# Patient Record
Sex: Male | Born: 1965 | Race: Black or African American | Hispanic: No | Marital: Married | Smoking: Never smoker
Health system: Southern US, Community
[De-identification: ages and names within clinical notes are randomized; demographics above are authoritative.]

## PROBLEM LIST (undated history)

## (undated) DIAGNOSIS — G4730 Sleep apnea, unspecified: Secondary | ICD-10-CM

## (undated) DIAGNOSIS — I10 Essential (primary) hypertension: Secondary | ICD-10-CM

## (undated) HISTORY — DX: Essential (primary) hypertension: I10

## (undated) HISTORY — DX: Sleep apnea, unspecified: G47.30

---

## 1999-04-03 HISTORY — PX: HERNIA REPAIR: SHX51

## 1999-07-27 ENCOUNTER — Ambulatory Visit (HOSPITAL_COMMUNITY): Admission: RE | Admit: 1999-07-27 | Discharge: 1999-07-27 | Payer: Self-pay | Admitting: General Surgery

## 2000-04-13 ENCOUNTER — Encounter: Payer: Self-pay | Admitting: Internal Medicine

## 2000-04-13 ENCOUNTER — Emergency Department (HOSPITAL_COMMUNITY): Admission: EM | Admit: 2000-04-13 | Discharge: 2000-04-13 | Payer: Self-pay | Admitting: Internal Medicine

## 2010-07-04 ENCOUNTER — Ambulatory Visit (INDEPENDENT_AMBULATORY_CARE_PROVIDER_SITE_OTHER): Payer: BC Managed Care – PPO | Admitting: Internal Medicine

## 2010-07-04 ENCOUNTER — Encounter: Payer: Self-pay | Admitting: Internal Medicine

## 2010-07-04 VITALS — BP 148/108 | HR 69 | Temp 98.4°F | Ht 69.0 in | Wt 210.8 lb

## 2010-07-04 DIAGNOSIS — R05 Cough: Secondary | ICD-10-CM

## 2010-07-04 DIAGNOSIS — R059 Cough, unspecified: Secondary | ICD-10-CM

## 2010-07-04 MED ORDER — FLUTICASONE PROPIONATE 50 MCG/ACT NA SUSP: 2.0000 | Freq: Every day | NASAL | Status: DC

## 2010-07-04 MED ORDER — FEXOFENADINE HCL 180 MG PO TABS: 180.0000 mg | ORAL_TABLET | Freq: Every day | ORAL | Status: AC

## 2010-07-04 NOTE — Assessment & Plan Note (Signed)
Your cough is likely due to sinus drainage Take allegra daily Use netti pot with bottled warm water and attached salt packet daily Use nasal steroid daily as directed Return to see me in 4-5 weeks Depending on above, you might need ENT referral  

## 2010-07-04 NOTE — Patient Instructions (Signed)
Your cough is likely due to sinus drainage Take allegra daily Use netti pot with bottled warm water and attached salt packet daily Use nasal steroid daily as directed Return to see me in 4-5 weeks Depending on above, you might need ENT referral

## 2010-07-04 NOTE — Progress Notes (Signed)
Subjective:    Patient ID: Dustin Cooper, male    DOB: [birth year], 45 y.o.   MRN: [id number]  Cough This is a new (insidious onset) problem. The current episode started more than 1 month ago (several months ago it started). The problem has been gradually worsening. The problem occurs constantly. The cough is productive of sputum (feels like a really bad smoker's cough. Mostly has sputum - mild amount of clear sputum. Turned green 3 days ago. ). Associated symptoms include nasal congestion, postnasal drip, rhinorrhea and sweats. Pertinent negatives include no chest pain, chills, ear congestion, ear pain, fever, headaches, heartburn, hemoptysis, myalgias, rash, sore throat, shortness of breath or wheezing. Associated symptoms comments: Feels embarassed about cough. Laughing making cough worse over past few weeks. Associated voice change +. Admits to periodic sinus drainage but constant nasal congestion for years. Used to use nose drops OTC but now stopped using it. Denies GERD. The symptoms are aggravated by other (laughing). Risk factors for lung disease include animal exposure (lived with cats and dogs in 2007 - 2010. ). He has tried OTC cough suppressant (Dr Eileen [facility] on [address] - changed bp med in response to cough 2 months ago but this change did not help. GERD OTC trial of  med nos for 1 month did not help. He cannot remember names of meds. Pharmacy stated he is taking bystolic hctz now) for the symptoms. The treatment provided no relief. There is no history of asthma, bronchiectasis, bronchitis, COPD, emphysema, environmental allergies or pneumonia.      Review of Systems  Constitutional: Negative for fever and chills.  HENT: Positive for rhinorrhea and postnasal drip. Negative for ear pain and sore throat.   Eyes: Negative.   Respiratory: Positive for cough. Negative for hemoptysis, choking, chest tightness, shortness of breath, wheezing and stridor.   Cardiovascular: Negative.   Negative for chest pain, palpitations and leg swelling.  Gastrointestinal: Negative.  Negative for heartburn.  Genitourinary: Negative.   Musculoskeletal: Negative.  Negative for myalgias.  Skin: Negative.  Negative for rash.  Neurological: Negative.  Negative for headaches.  Hematological: Negative.  Negative for environmental allergies.  Psychiatric/Behavioral: Negative.        Objective:   Physical Exam  Nursing note and vitals reviewed. Constitutional: He is oriented to person, place, and time. He appears well-developed and well-nourished. No distress.       obese  HENT:  Head: Normocephalic and atraumatic.  Right Ear: External ear normal.  Left Ear: External ear normal.  Mouth/Throat: Oropharynx is clear and moist. No oropharyngeal exudate.       Swollen nasal turbinates bilaterally  Eyes: Conjunctivae and EOM are normal. Pupils are equal, round, and reactive to light. Right eye exhibits no discharge. Left eye exhibits no discharge. No scleral icterus.  Neck: Normal range of motion. Neck supple. No JVD present. No tracheal deviation present. No thyromegaly present.  Cardiovascular: Normal rate, regular rhythm and intact distal pulses.  Exam reveals no gallop and no friction rub.   No murmur heard. Pulmonary/Chest: Effort normal and breath sounds normal. No respiratory distress. He has no wheezes. He has no rales. He exhibits no tenderness.  Abdominal: Soft. Bowel sounds are normal. He exhibits no distension and no mass. There is no tenderness. There is no rebound and no guarding.  Musculoskeletal: Normal range of motion. He exhibits no edema and no tenderness.  Lymphadenopathy:    He has no cervical adenopathy.  Neurological: He is alert and oriented  to person, place, and time. He has normal reflexes. No cranial nerve deficit. Coordination normal.  Skin: Skin is warm and dry. No rash noted. He is not diaphoretic. No erythema. No pallor.  Psychiatric: He has a normal mood and  affect. His behavior is normal. Judgment and thought content normal.          Assessment & Plan:

## 2010-08-07 ENCOUNTER — Ambulatory Visit: Payer: BC Managed Care – PPO | Admitting: Internal Medicine

## 2011-02-09 ENCOUNTER — Encounter (HOSPITAL_COMMUNITY): Payer: Self-pay | Admitting: *Deleted

## 2011-02-09 ENCOUNTER — Emergency Department (HOSPITAL_COMMUNITY)
Admission: EM | Admit: 2011-02-09 | Discharge: 2011-02-09 | Disposition: A | Discharge status: Home / Self Care | Payer: BC Managed Care – PPO | Attending: Emergency Medicine | Admitting: Emergency Medicine

## 2011-02-09 ENCOUNTER — Emergency Department (HOSPITAL_COMMUNITY): Payer: BC Managed Care – PPO

## 2011-02-09 DIAGNOSIS — G510 Bell's palsy: Secondary | ICD-10-CM | POA: Insufficient documentation

## 2011-02-09 DIAGNOSIS — I10 Essential (primary) hypertension: Secondary | ICD-10-CM | POA: Insufficient documentation

## 2011-02-09 DIAGNOSIS — R209 Unspecified disturbances of skin sensation: Secondary | ICD-10-CM | POA: Insufficient documentation

## 2011-02-09 MED ORDER — HYDROCHLOROTHIAZIDE 25 MG PO TABS: 25.0000 mg | ORAL_TABLET | Freq: Every day | ORAL | Status: AC

## 2011-02-09 MED ORDER — CLONIDINE HCL 0.1 MG PO TABS
0.1000 mg | ORAL_TABLET | Freq: Once | ORAL | Status: AC
  Administered 2011-02-09: 0.1 mg via ORAL
  Filled 2011-02-09: qty 1

## 2011-02-09 MED ORDER — ACYCLOVIR 400 MG PO TABS: ORAL_TABLET | ORAL | Status: AC

## 2011-02-09 MED ORDER — PREDNISONE 20 MG PO TABS: ORAL_TABLET | ORAL | Status: AC

## 2011-02-09 MED ORDER — METOPROLOL TARTRATE 50 MG PO TABS: ORAL_TABLET | ORAL | Status: AC

## 2011-02-09 NOTE — ED Notes (Signed)
NWG:[id number]<ZH> Expected date:02/09/11<BR> Expected time: 6:05 PM<BR> Means of arrival:Ambulance<BR> Comments:<BR> EMS 12 GC, 38 yof abd. Pain CA pt

## 2011-02-09 NOTE — ED Notes (Signed)
Pt discharged home with family member; voiced understanding of need for bp med compliance and follow up; continues to c/o right sided facial numbness; no other complaints

## 2011-02-09 NOTE — ED Provider Notes (Cosign Needed)
History     CSN: [id number] Arrival date & time: 02/09/2011  5:27 PM   First MD Initiated Contact with Patient 02/09/11 1932      Chief Complaint  Patient presents with  . Facial Numbness      This morning    (Consider location/radiation/quality/duration/timing/severity/associated sxs/prior treatment) HPI  Patient relates he noticed Lack of taste starting about 3 days ago. He states this morning he woke up and noticed the right side of his face felt numb. He denies any difficulty swallowing or drinking. He states he had a frontal headache when he first got up this morning but he thought was his sinuses but now is gone. He denies any visual changes. He states he does not have ear pain. He also notes he is having trouble closing his right thigh. Patient relates he has hypertension and is been off his medicine for at least a month. He denies any numbness or tingling in his arms or legs difficulty speaking or walking. He states he's never had this before. Nothing makes it feel worse nothing makes it feel better.  [facility]  Past Medical History  Diagnosis Date  . Hypertension   . Sleep apnea     Past Surgical History  Procedure Date  . Hernia repair 2001    Family History  Problem Relation Age of Onset  . Heart disease Mother   . Heart failure Mother   . Hypertension Mother     History  Substance Use Topics  . Smoking status: Never Smoker   . Smokeless tobacco: Not on file  . Alcohol Use: Yes     3-4 times per week   Employed Lives with spouse   Review of Systems  All other systems reviewed and are negative.    Allergies  Review of patient's allergies indicates no known allergies.  Home Medications   Current Outpatient Rx  Name Route Sig Dispense Refill  . ASPIRIN 81 MG PO TABS Oral Take 81 mg by mouth daily.      Marland Kitchen HYDROCHLOROTHIAZIDE 25 MG PO TABS Oral Take 25 mg by mouth daily.      . NEBIVOLOL HCL 10 MG PO TABS  Oral Take 20 mg by mouth daily.         BP 177/135  Pulse 60  Temp(Src) 99.4 F (37.4 C) (Oral)  Resp 17  SpO2 98%    Vital signs hypertension otherwise normal  Physical Exam  Vitals reviewed. Constitutional: He is oriented to person, place, and time. He appears well-developed and well-nourished.  HENT:  Head: Normocephalic and atraumatic.  Mouth/Throat: Uvula is midline, oropharynx is clear and moist and mucous membranes are normal.  Eyes: Conjunctivae and EOM are normal. Pupils are equal, round, and reactive to light.  Neck: Normal range of motion. Neck supple.  Cardiovascular: Normal rate, regular rhythm and normal heart sounds.   Pulmonary/Chest: Effort normal and breath sounds normal.  Abdominal: Soft. Bowel sounds are normal.  Musculoskeletal: Normal range of motion.  Neurological: He is alert and oriented to person, place, and time.       Patient is noted to have a right facial droop when he smiles. He has no loss of range of motion of his tongue. He has difficulty shutting his right thigh and there is a small gap that he is unable to close about 2-3 mm. He does however have equal eyebrow movement bilaterally. He has no other focal motor deficit.  ED Course  Procedures (including critical care time) Patient given clonidine 0.1 mg orally his blood pressure improved to the 177/124 range.     Ct Head Wo Contrast  02/09/2011  *RADIOLOGY REPORT*  Clinical Data: Right-sided facial weakness.  CT HEAD WITHOUT CONTRAST  Technique:  Contiguous axial images were obtained from the base of the skull through the vertex without contrast.  Comparison: None  Findings: The ventricles are normal.  No extra-axial fluid collections are seen.  The brainstem and cerebellum are unremarkable.  No acute intracranial findings such as infarction or hemorrhage.  No mass lesions.  The bony calvarium is intact.  The visualized paranasal sinuses and mastoid air cells are clear.  IMPRESSION: No acute  intracranial findings or mass lesion.  Original Report Authenticated By: P. Loralie Champagne, M.D.      Diagnoses that have been ruled out:  Diagnoses that are still under consideration:  Final diagnoses:  Bell's palsy  Hypertension     Medications  hydrochlorothiazide (HYDRODIURIL) 25 MG tablet (not administered)  metoprolol (LOPRESSOR) 50 MG tablet (not administered)  predniSONE (DELTASONE) 20 MG tablet (not administered)  acyclovir (ZOVIRAX) 400 MG tablet (not administered)  cloNIDine (CATAPRES) tablet 0.1 mg (0.1 mg Oral Given 02/09/11 2051)   Plan discharge  Devoria Albe, MD, FACEP   MDM          Ward Givens, MD 02/10/11 [phone]

## 2011-02-09 NOTE — ED Notes (Signed)
Tuesday patient reports that he lost ability to taste food and then this morning patient woke up with AM with inability to frown, smile or chew using the right side of his face. Pt denies similar prior history. No pain. Right eye vision also feels blurred. Pt is alert and oriented and voice is clear and not garbled. No issues with ambulation.  Equal upper extremities.

## 2011-02-09 NOTE — ED Notes (Signed)
Pt resting quietly; no change in assessment; family at bedside

## 2011-07-11 ENCOUNTER — Encounter: Payer: Self-pay | Admitting: *Deleted

## 2012-07-27 IMAGING — CT CT HEAD W/O CM
2 series · 16 of 30 positions shown, 20 images · non-contrast
Comparison: None

CLINICAL DATA: Right-sided facial weakness.

CT HEAD WITHOUT CONTRAST
TECHNIQUE: Contiguous axial images were obtained from the base of
the skull through the vertex without contrast.

[Series 2: head w/o · axial · non-contrast · 0.43mm/px · z∈[+1186,+1316]mm · 13 of 32 slices shown, 17 images]
[im 3/32  brain]
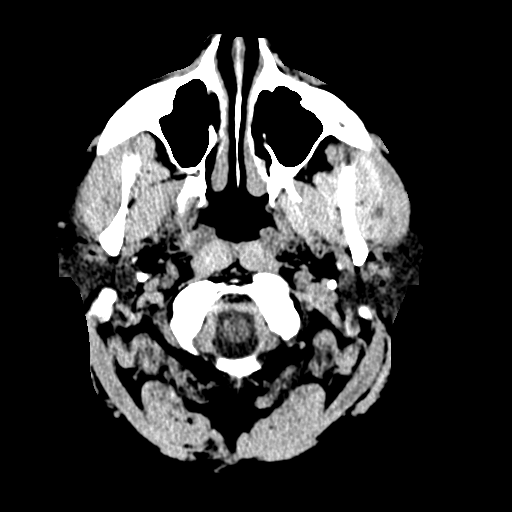
[im 3/32  bone]
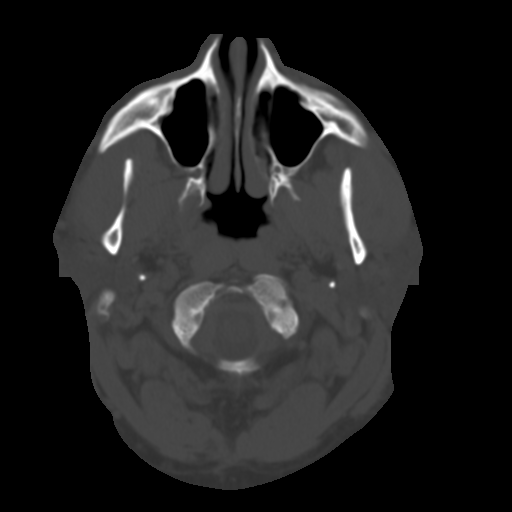
[im 5/32  brain]
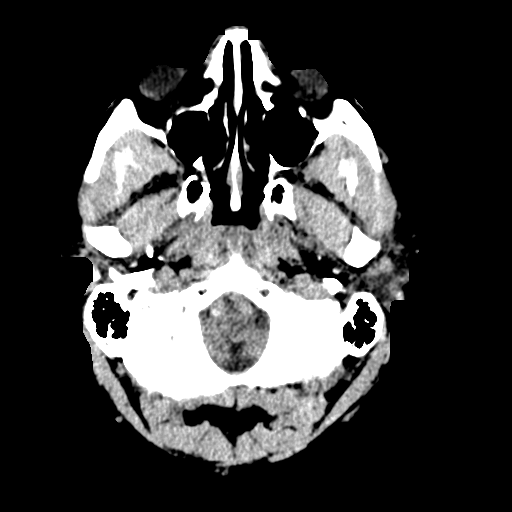
[im 7/32  brain]
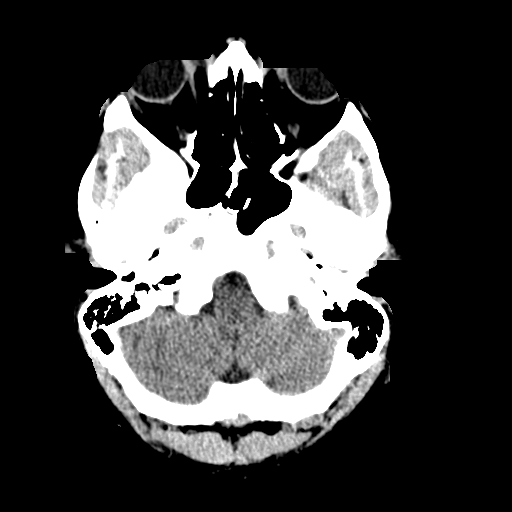
[im 9/32  brain]
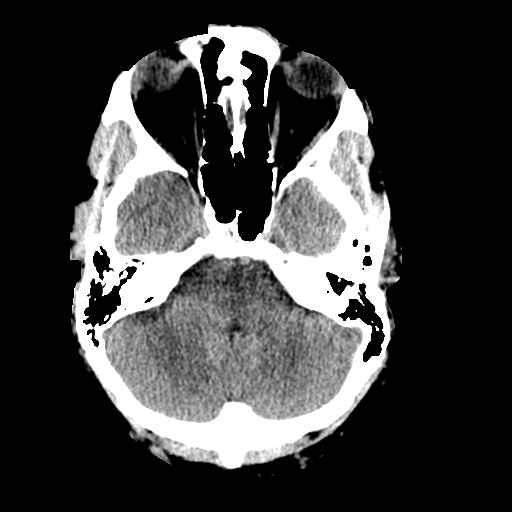
[im 12/32  brain]
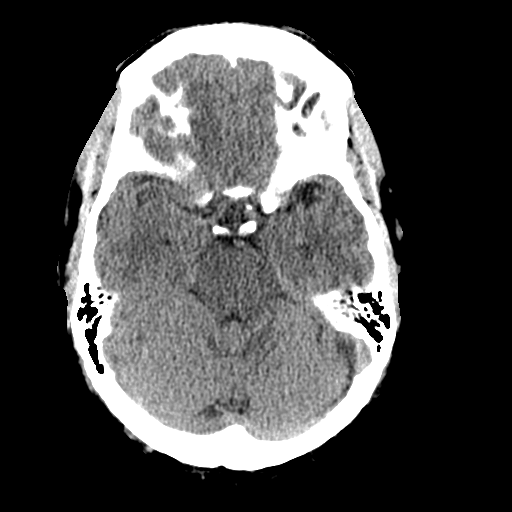
[im 12/32  bone]
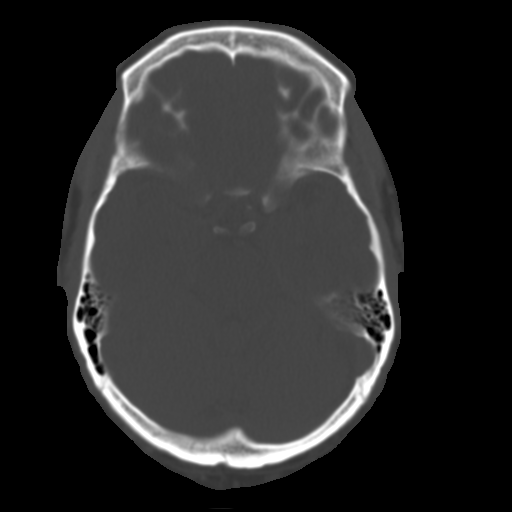
[im 14/32  brain]
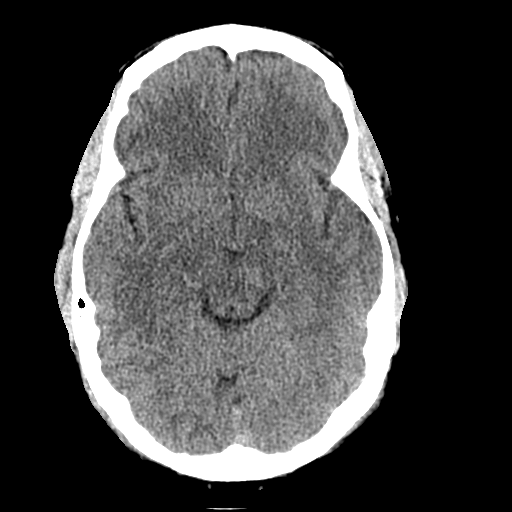
[im 16/32  brain]
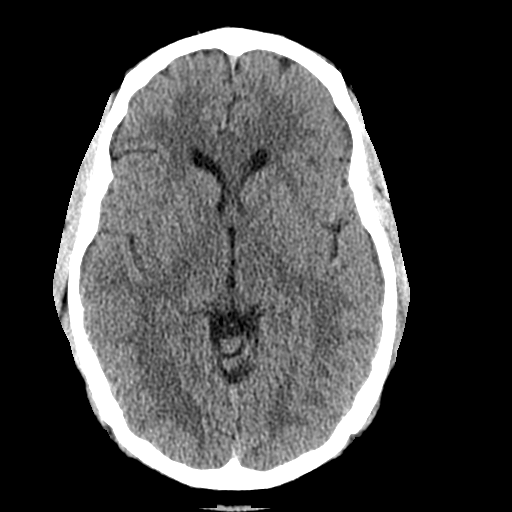
[im 18/32  brain]
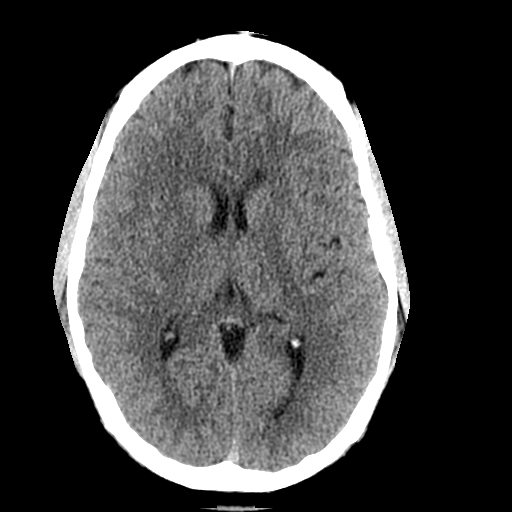
[im 20/32  brain]
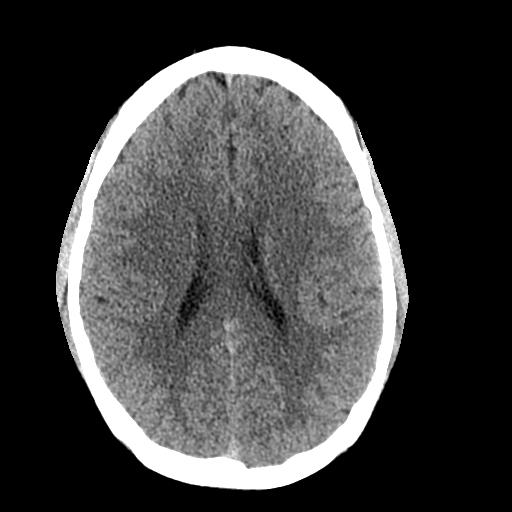
[im 20/32  bone]
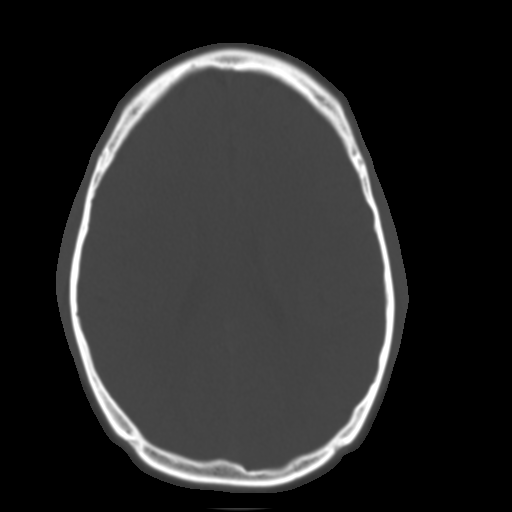
[im 23/32  brain]
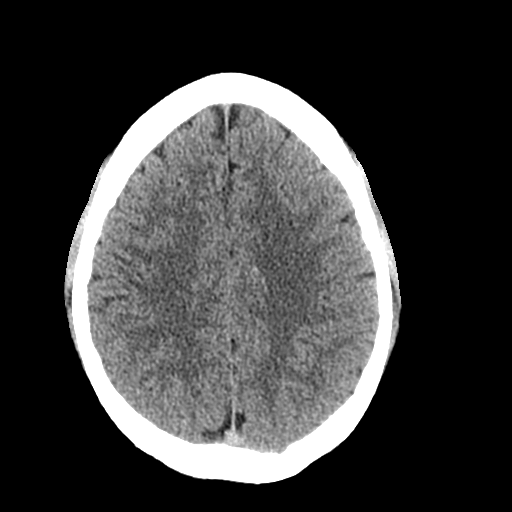
[im 25/32  brain]
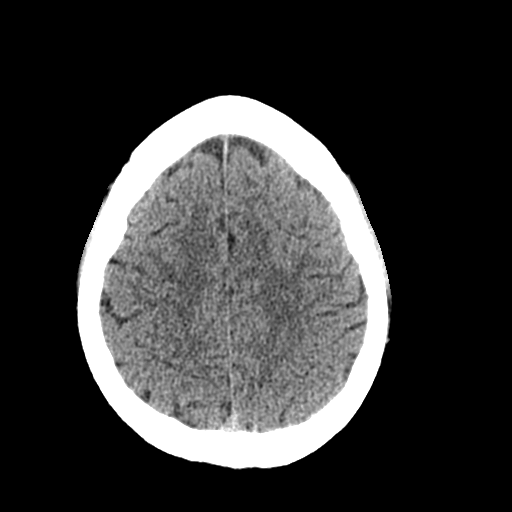
[im 27/32  brain]
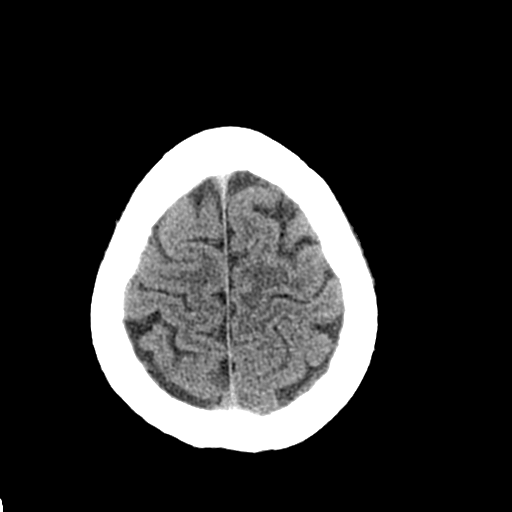
[im 29/32  brain]
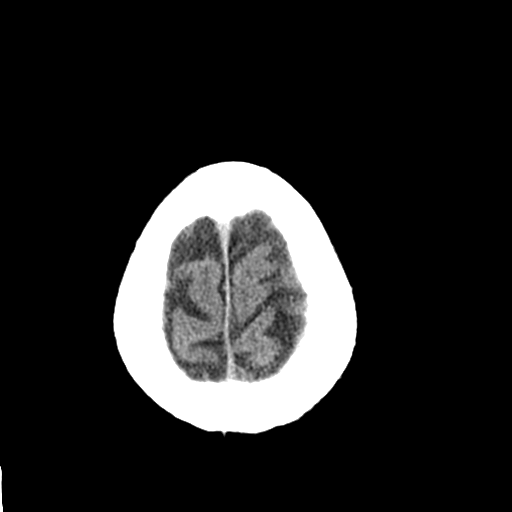
[im 29/32  bone]
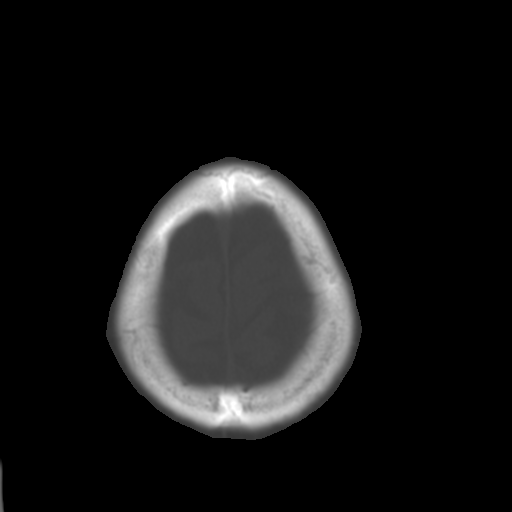

[Series 3: bone windows · axial · 0.43mm/px · z∈[+1186,+1231]mm · 3 of 32 slices shown]
[im 3/32  bone]
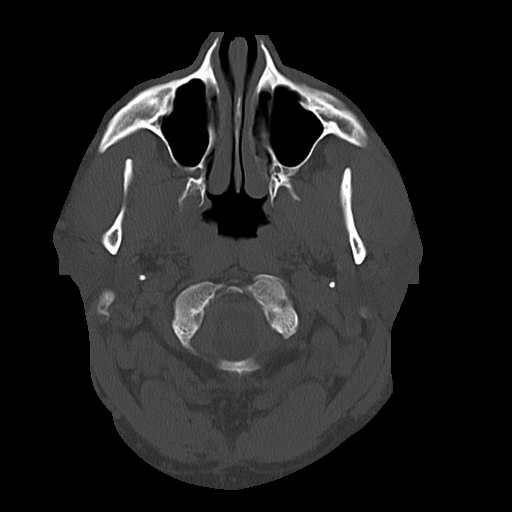
[im 7/32  bone]
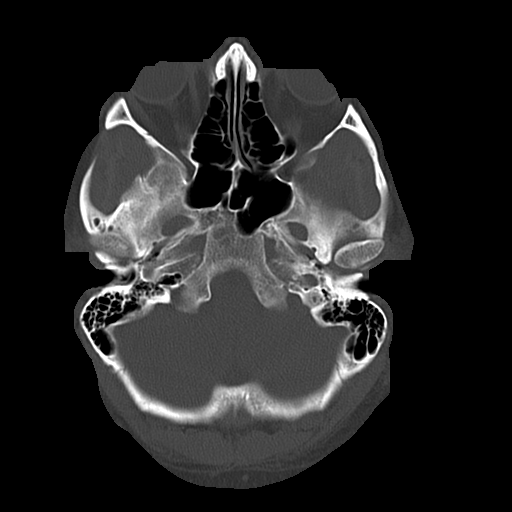
[im 12/32  bone]
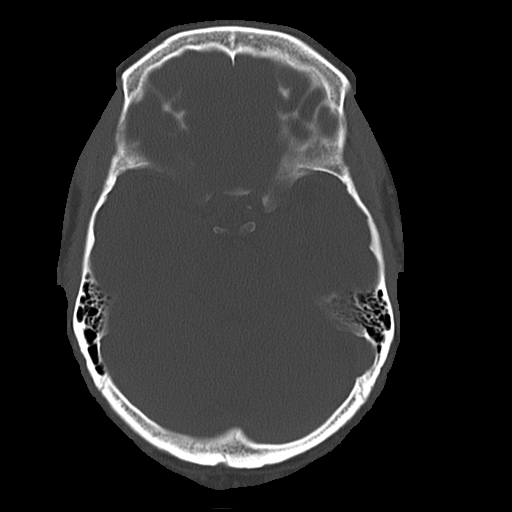

[16 of 30 positions shown; findings below may reference images not displayed]

FINDINGS: The ventricles are normal.  No extra-axial fluid
collections are seen.  The brainstem and cerebellum are
unremarkable.  No acute intracranial findings such as infarction or
hemorrhage.  No mass lesions.

The bony calvarium is intact.  The visualized paranasal sinuses and
mastoid air cells are clear.
IMPRESSION: No acute intracranial findings or mass lesion.

## 2015-01-09 ENCOUNTER — Encounter (HOSPITAL_COMMUNITY): Payer: Self-pay | Admitting: Emergency Medicine

## 2015-01-09 ENCOUNTER — Emergency Department (HOSPITAL_COMMUNITY)
Admission: EM | Admit: 2015-01-09 | Discharge: 2015-01-10 | Disposition: A | Discharge status: Home / Self Care | Payer: BLUE CROSS/BLUE SHIELD | Attending: Emergency Medicine | Admitting: Emergency Medicine

## 2015-01-09 DIAGNOSIS — R1013 Epigastric pain: Secondary | ICD-10-CM | POA: Diagnosis present

## 2015-01-09 DIAGNOSIS — Z7982 Long term (current) use of aspirin: Secondary | ICD-10-CM | POA: Diagnosis not present

## 2015-01-09 DIAGNOSIS — Z79899 Other long term (current) drug therapy: Secondary | ICD-10-CM | POA: Insufficient documentation

## 2015-01-09 DIAGNOSIS — IMO0001 Reserved for inherently not codable concepts without codable children: Secondary | ICD-10-CM

## 2015-01-09 DIAGNOSIS — I10 Essential (primary) hypertension: Secondary | ICD-10-CM | POA: Diagnosis not present

## 2015-01-09 DIAGNOSIS — Z8669 Personal history of other diseases of the nervous system and sense organs: Secondary | ICD-10-CM | POA: Insufficient documentation

## 2015-01-09 DIAGNOSIS — K2970 Gastritis, unspecified, without bleeding: Secondary | ICD-10-CM | POA: Diagnosis not present

## 2015-01-09 NOTE — ED Notes (Signed)
Bed: WA19 Expected date:  Expected time:  Means of arrival:  Comments: T2 

## 2015-01-09 NOTE — ED Notes (Signed)
Pt states around 10:30 was sitting at home watching TV and felt a sudden stabbing/tearing/shooting pain go from the center of his chest down into his abdomin. Pt states he felt nauseous on the way here, and is continuing to feel the pain at this time.

## 2015-01-10 ENCOUNTER — Emergency Department (HOSPITAL_COMMUNITY): Payer: BLUE CROSS/BLUE SHIELD

## 2015-01-10 ENCOUNTER — Encounter (HOSPITAL_COMMUNITY): Payer: Self-pay | Admitting: Emergency Medicine

## 2015-01-10 LAB — HEPATIC FUNCTION PANEL
ALBUMIN: 3.9 g/dL (ref 3.5–5.0)
ALK PHOS: 75 U/L (ref 38–126)
ALT: 63 U/L (ref 17–63)
AST: 109 U/L — ABNORMAL HIGH (ref 15–41)
BILIRUBIN INDIRECT: 1.1 mg/dL — AB (ref 0.3–0.9)
Bilirubin, Direct: 0.3 mg/dL (ref 0.1–0.5)
TOTAL PROTEIN: 7.5 g/dL (ref 6.5–8.1)
Total Bilirubin: 1.4 mg/dL — ABNORMAL HIGH (ref 0.3–1.2)

## 2015-01-10 LAB — BASIC METABOLIC PANEL
Anion gap: 9 (ref 5–15)
BUN: 17 mg/dL (ref 6–20)
CHLORIDE: 107 mmol/L (ref 101–111)
CO2: 24 mmol/L (ref 22–32)
CREATININE: 1.34 mg/dL — AB (ref 0.61–1.24)
Calcium: 9.6 mg/dL (ref 8.9–10.3)
Glucose, Bld: 103 mg/dL — ABNORMAL HIGH (ref 65–99)
POTASSIUM: 3.4 mmol/L — AB (ref 3.5–5.1)
SODIUM: 140 mmol/L (ref 135–145)

## 2015-01-10 LAB — LIPASE, BLOOD: LIPASE: 63 U/L — AB (ref 22–51)

## 2015-01-10 LAB — CBC
HCT: 50.1 % (ref 39.0–52.0)
Hemoglobin: 16.7 g/dL (ref 13.0–17.0)
MCH: 28.7 pg (ref 26.0–34.0)
MCHC: 33.3 g/dL (ref 30.0–36.0)
MCV: 86.2 fL (ref 78.0–100.0)
PLATELETS: 165 10*3/uL (ref 150–400)
RBC: 5.81 MIL/uL (ref 4.22–5.81)
RDW: 13.5 % (ref 11.5–15.5)
WBC: 13.7 10*3/uL — AB (ref 4.0–10.5)

## 2015-01-10 LAB — I-STAT TROPONIN, ED: Troponin i, poc: 0 ng/mL (ref 0.00–0.08)

## 2015-01-10 MED ORDER — OMEPRAZOLE 20 MG PO CPDR: 20.0000 mg | DELAYED_RELEASE_CAPSULE | Freq: Every day | ORAL | Status: AC

## 2015-01-10 MED ORDER — KETOROLAC TROMETHAMINE 30 MG/ML IJ SOLN
30.0000 mg | Freq: Once | INTRAMUSCULAR | Status: AC
  Administered 2015-01-10: 30 mg via INTRAVENOUS
  Filled 2015-01-10: qty 1

## 2015-01-10 MED ORDER — ONDANSETRON HCL 4 MG/2ML IJ SOLN
4.0000 mg | Freq: Once | INTRAMUSCULAR | Status: AC
  Administered 2015-01-10: 4 mg via INTRAVENOUS
  Filled 2015-01-10: qty 2

## 2015-01-10 MED ORDER — GI COCKTAIL ~~LOC~~
30.0000 mL | Freq: Once | ORAL | Status: AC
  Administered 2015-01-10: 30 mL via ORAL
  Filled 2015-01-10: qty 30

## 2015-01-10 MED ORDER — SODIUM CHLORIDE 0.9 % IV BOLUS (SEPSIS)
1000.0000 mL | Freq: Once | INTRAVENOUS | Status: AC
  Administered 2015-01-10: 1000 mL via INTRAVENOUS

## 2015-01-10 MED ORDER — DICYCLOMINE HCL 10 MG/ML IM SOLN
20.0000 mg | Freq: Once | INTRAMUSCULAR | Status: AC
  Administered 2015-01-10: 20 mg via INTRAMUSCULAR
  Filled 2015-01-10: qty 2

## 2015-01-10 NOTE — Discharge Instructions (Signed)

## 2015-01-10 NOTE — ED Provider Notes (Signed)
CSN: [id number]     Arrival date & time 01/09/15  2321 History  By signing my name below, I, Dustin Cooper, attest that this documentation has been prepared under the direction and in the presence of Taavi Hoose, MD. Electronically Signed: Ronney Cooper, ED Scribe. 01/10/2015. 12:46 AM.   Chief Complaint  Patient presents with  . Chest Pain  . Hypertension  . Abdominal Pain   Patient is a 49 y.o. male presenting with abdominal pain. The history is provided by the patient. No language interpreter was used.  Abdominal Pain Pain location:  Epigastric Pain quality: aching and stabbing   Pain radiates to:  Periumbilical region Pain severity:  Severe Onset quality:  Gradual Duration:  10 hours Timing:  Constant Progression:  Worsening Chronicity:  New Context: eating   Context: not sick contacts and not trauma   Relieved by:  OTC medications (Gas-X) Worsened by:  Nothing tried Ineffective treatments:  None tried Associated symptoms: flatus   Associated symptoms: no belching, no chest pain, no constipation, no diarrhea, no fatigue, no fever and no vomiting   Risk factors: has not had multiple surgeries     HPI Comments: Dustin Cooper is a 49 y.o. male who presents to the Emergency Department complaining of constant, severe pain that begins in his epigastrium and radiates down to his umbilicus, with onset 2 hours ago while sitting at home. Patient reports earlier today he ate [place] food, including steak and refried beans, which caused an uncomfortable amount flatus. He then took Gas-X, which alleviated his flatus. Patient states he last ate fast food rice (from [employer]) about 7 hours ago, at 5 PM. Patient notes on a regular day, he eats greasy and fried foods. He also eats [employer] bars. Patient denies any known history of GERD. He denies frequent belching.   Past Medical History  Diagnosis Date  . Hypertension   . Sleep apnea    Past Surgical History  Procedure Laterality Date  .  Hernia repair  2001   Family History  Problem Relation Age of Onset  . Heart disease Mother   . Heart failure Mother   . Hypertension Mother    Social History  Substance Use Topics  . Smoking status: Never Smoker   . Smokeless tobacco: None  . Alcohol Use: Yes     Comment: 3-4 times per week    Review of Systems  Constitutional: Negative for fever and fatigue.  Cardiovascular: Negative for chest pain.  Gastrointestinal: Positive for abdominal pain and flatus. Negative for vomiting, diarrhea and constipation.  All other systems reviewed and are negative.  Allergies  Review of patient's allergies indicates no known allergies.  Home Medications   Prior to Admission medications   Medication Sig Start Date End Date Taking? Authorizing Provider  acyclovir (ZOVIRAX) 400 MG tablet Take 1 po 5 times a day for 7 days 02/09/11   Devoria Albe, MD  aspirin 81 MG tablet Take 81 mg by mouth daily.      Historical Provider, MD  hydrochlorothiazide (HYDRODIURIL) 25 MG tablet Take 25 mg by mouth daily.      Historical Provider, MD  hydrochlorothiazide (HYDRODIURIL) 25 MG tablet Take 1 tablet (25 mg total) by mouth daily. 02/09/11 02/09/12  Devoria Albe, MD  metoprolol (LOPRESSOR) 50 MG tablet Take 1 po QD 02/09/11   Devoria Albe, MD  predniSONE (DELTASONE) 20 MG tablet Take 3 po QD x 3d , then 2 po QD x 3d then 1 po QD x  3d 02/09/11   Devoria Albe, MD   BP 168/113 mmHg  Pulse 102  Temp(Src) 98 F (36.7 C) (Oral)  Resp 17  SpO2 99% Physical Exam  Constitutional: He is oriented to person, place, and time. He appears well-developed and well-nourished. No distress.  HENT:  Head: Normocephalic and atraumatic.  Mouth/Throat: Oropharynx is clear and moist.  Moist mm.  Eyes: Conjunctivae and EOM are normal. Pupils are equal, round, and reactive to light.  Neck: Normal range of motion. Neck supple. No tracheal deviation present.  Cardiovascular: Normal rate and regular rhythm.   Pulmonary/Chest: Effort normal  and breath sounds normal. No respiratory distress. He has no wheezes. He has no rales. He exhibits no tenderness.  Abdominal: Soft. Bowel sounds are increased. There is no tenderness. There is no rigidity, no rebound, no guarding, no tenderness at McBurney's point and negative Murphy's sign.  Hyperactive bowel sounds throughout.  Musculoskeletal: Normal range of motion.  Neurological: He is alert and oriented to person, place, and time.  Skin: Skin is warm and dry.  Psychiatric: He has a normal mood and affect. His behavior is normal.  Nursing note and vitals reviewed.   ED Course  Procedures (including critical care time)  DIAGNOSTIC STUDIES: Oxygen Saturation is 99% on RA, normal by my interpretation.    COORDINATION OF CARE: 12:13 AM - Suspect pain is from gas, based on patient's diet. Discussed treatment plan with patient at bedside, which includes symptomatic control here. Will also obtain LFTs. Pt verbalized understanding and agreed to plan.   Labs Review Labs Reviewed  BASIC METABOLIC PANEL - Abnormal; Notable for the following:    Potassium 3.4 (*)    Glucose, Bld 103 (*)    Creatinine, Ser 1.34 (*)    All other components within normal limits  CBC - Abnormal; Notable for the following:    WBC 13.7 (*)    All other components within normal limits  HEPATIC FUNCTION PANEL  LIPASE, BLOOD  I-STAT TROPOININ, ED    Imaging Review No results found. I have personally reviewed and evaluated these images and lab results as part of my medical decision-making.   EKG Interpretation   Date/Time:  Sunday January 09 2015 23:29:24 EDT Ventricular Rate:  107 PR Interval:  155 QRS Duration: 87 QT Interval:  311 QTC Calculation: 415 R Axis:   30 Text Interpretation:  Sinus tachycardia Confirmed by [facility]  MD,  Irine Heminger ([id number]) on 01/10/2015 12:25:24 AM      MDM   Final diagnoses:  None   Pain gone post medication.  Patient states he drinks a few beers every few days  and this may explain the elevated AST.  No murphy's sign.  Was not given narcotics to contract the GB.  Have recommended no ETOH, GERD friendly diet and follow up as outpatient for HIDA scan.  Patient and family are amenable to this plan.  Strict return precautions given.    I, Linda Biehn-RASCH,Clarita Mcelvain K, personally performed the services described in this documentation. All medical record entries made by the scribe were at my direction and in my presence.  I have reviewed the chart and discharge instructions and agree that the record reflects my personal performance and is accurate and complete. Tammela Bales-RASCH,Airis Barbee K.  01/10/2015. 5:01 AM.       Teion Ballin, MD 01/10/15 6045

## 2016-06-27 IMAGING — CR DG CHEST 2V
2 series · 2 of 2 positions shown · non-contrast
Comparison: CT Abdomen and Pelvis 03/10/2012.

CLINICAL DATA: 49-year-old male with midchest in upper abdominal
pain for 3 hours. Initial encounter.

EXAM:
CHEST  2 VIEW

[w chest pa]
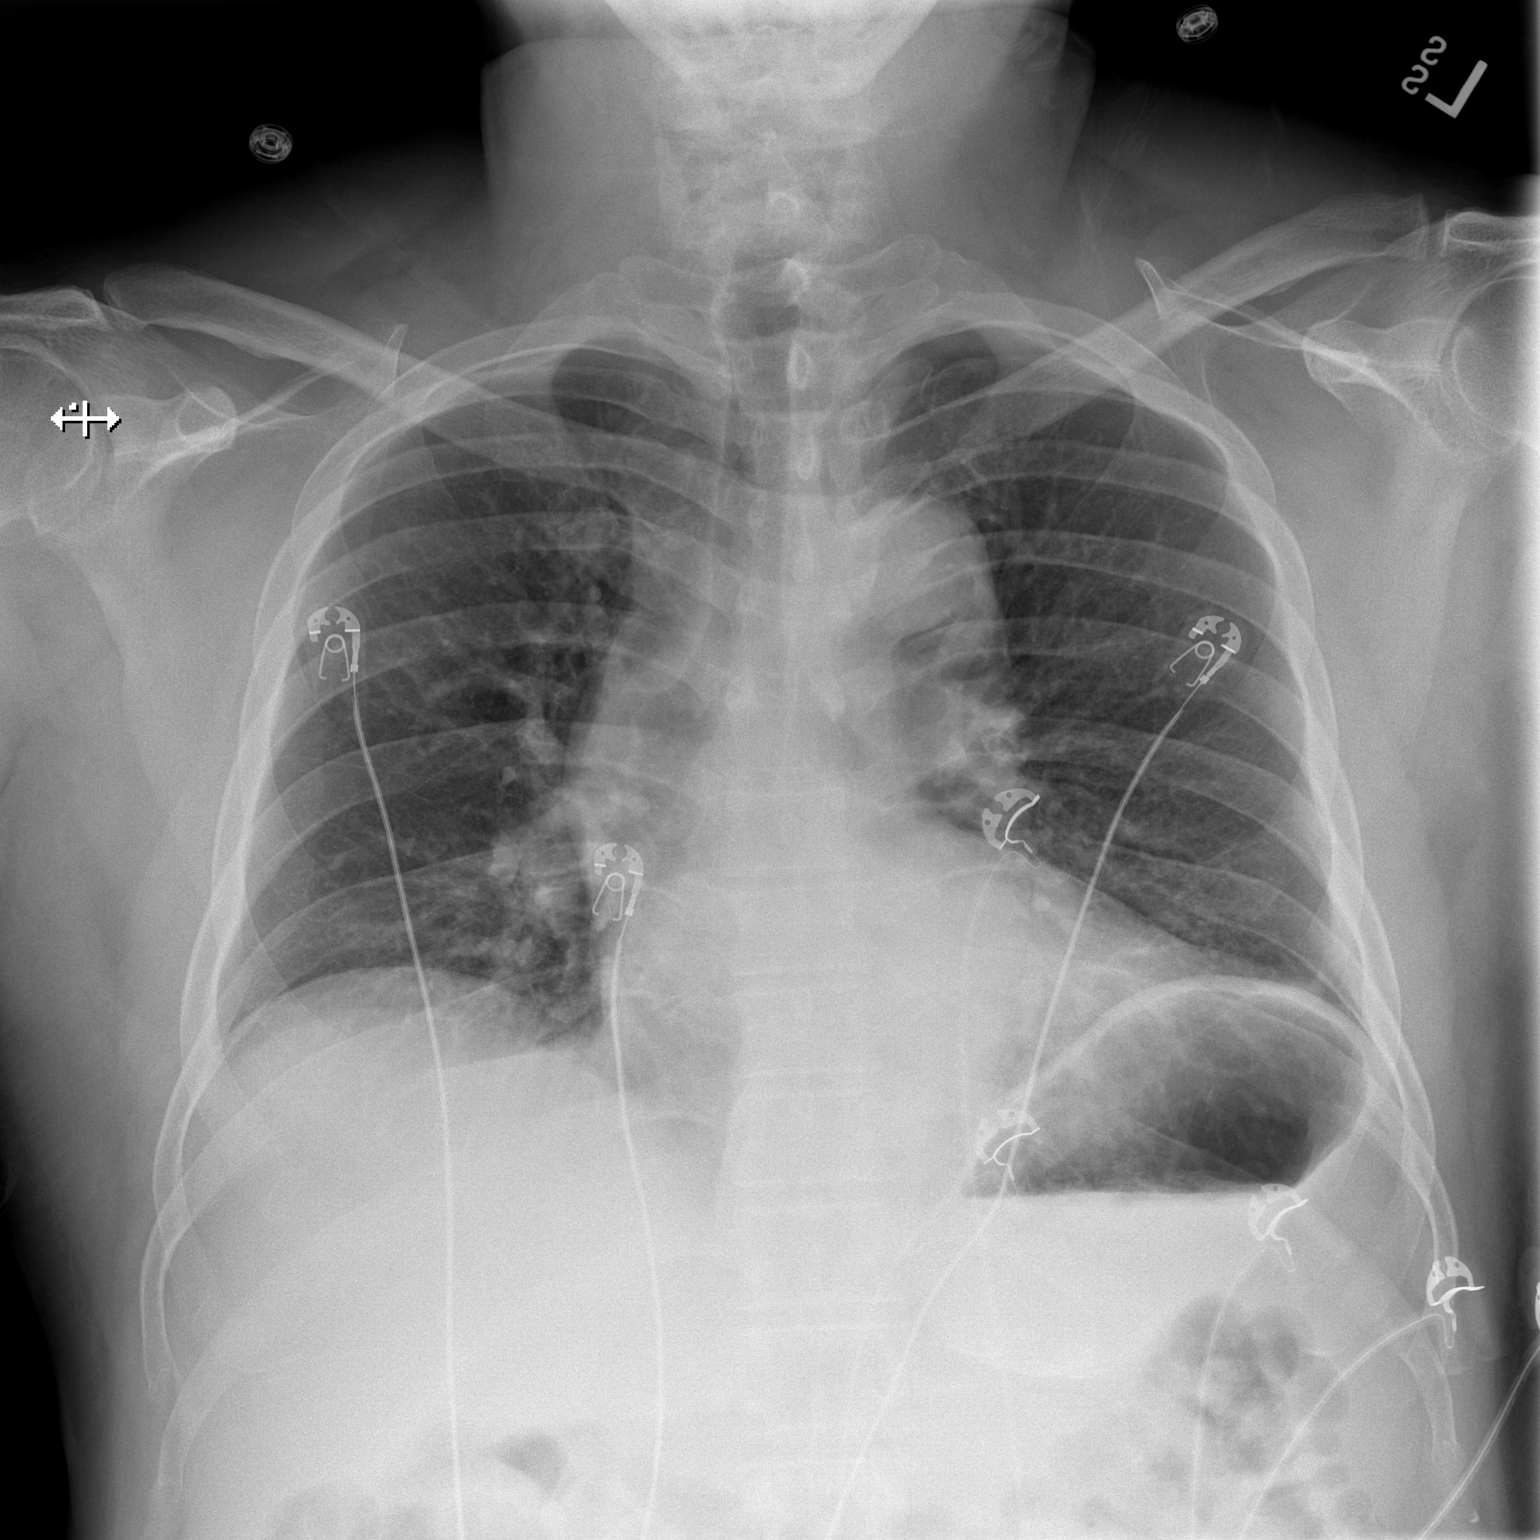

[w chest lat]
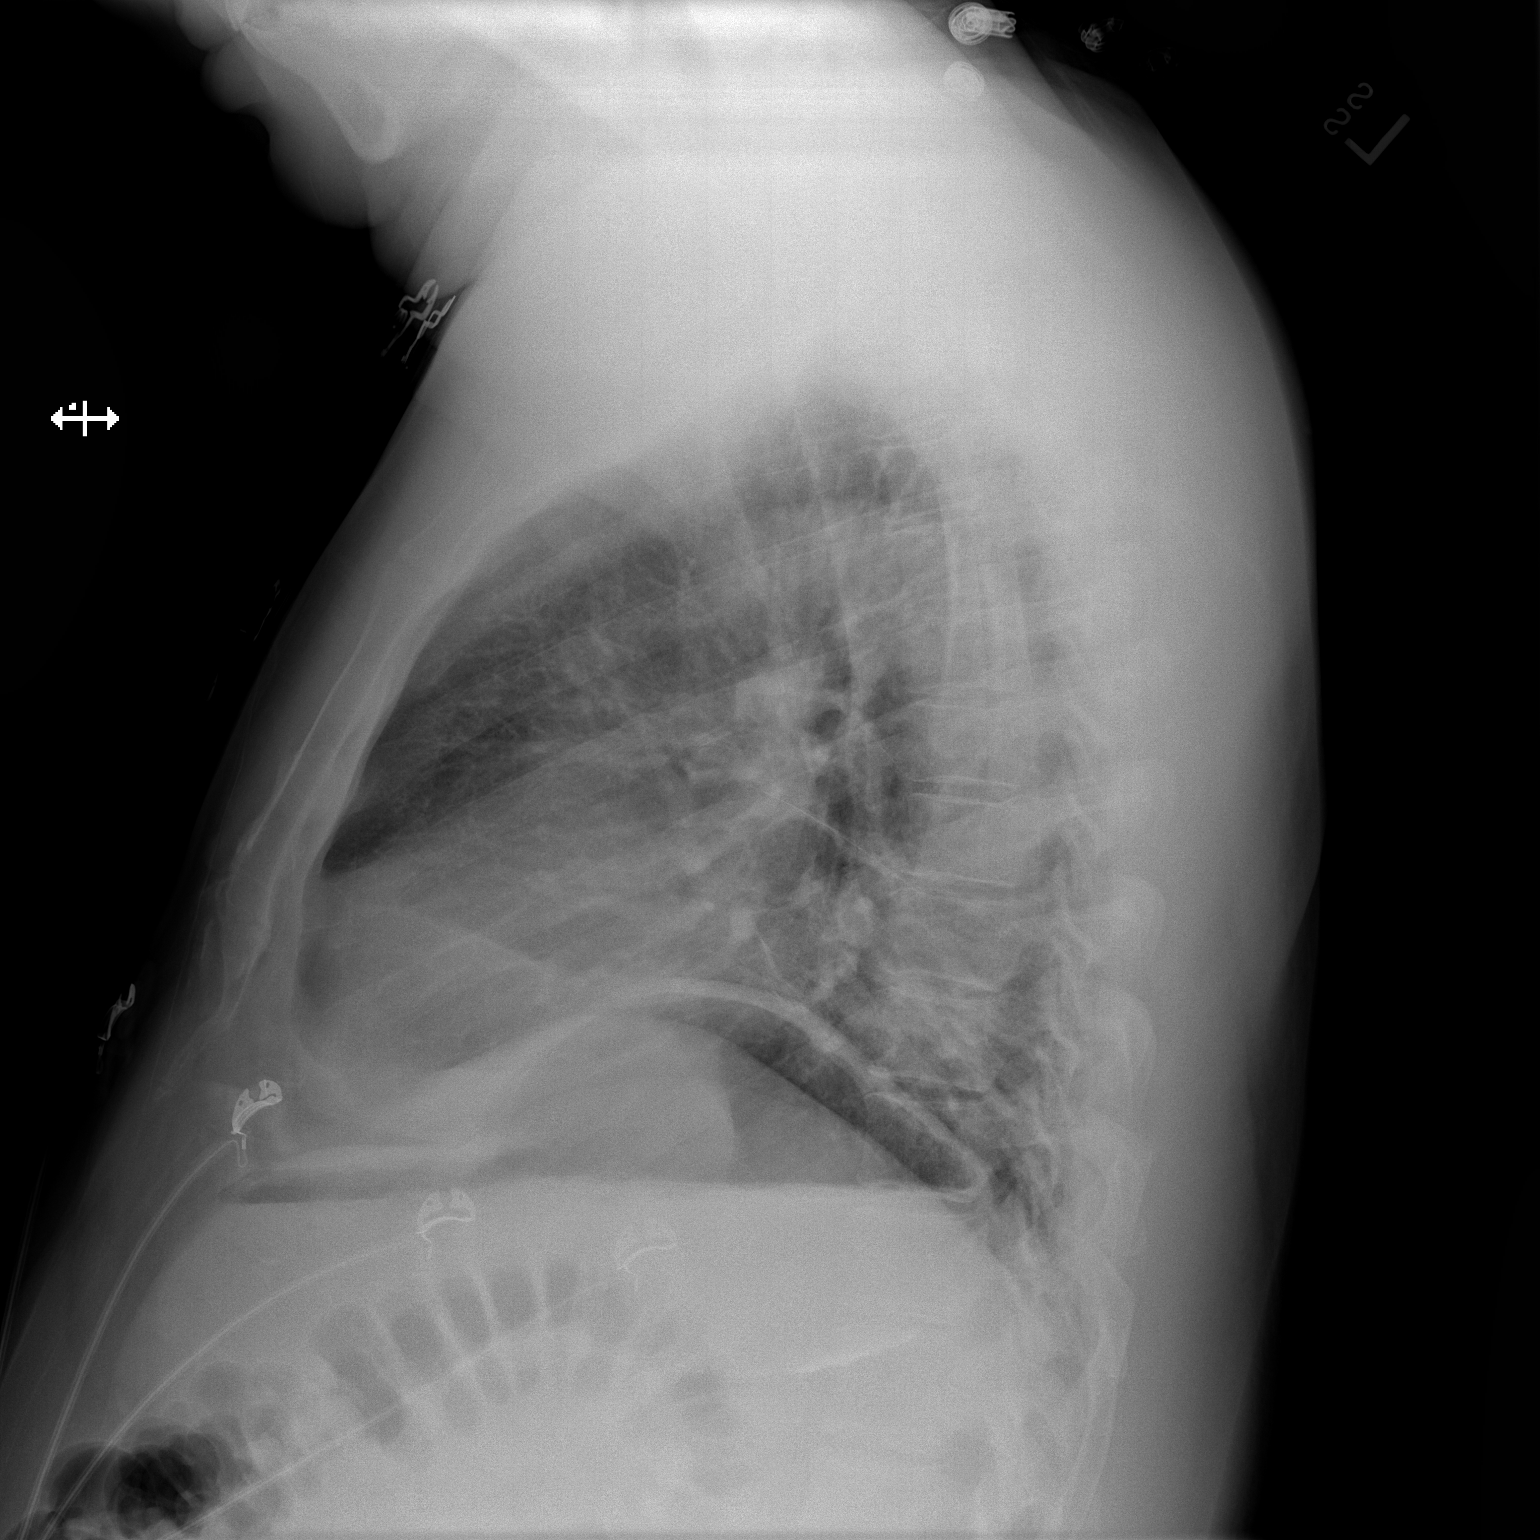

[2 of 2 positions shown; findings below may reference images not displayed]

FINDINGS: Low lung volumes. Fluid and gas mildly to moderately distending the
stomach in the left upper quadrant and epigastrium. Otherwise
negative visible bowel gas pattern. No pneumoperitoneum or
pneumothorax. Normal cardiac size and mediastinal contours.
Visualized tracheal air column is within normal limits. No pulmonary
edema, pleural effusion or consolidation. No acute osseous
abnormality identified.
IMPRESSION: 1. Low lung volumes, otherwise no acute cardiopulmonary abnormality.
2. Gas and fluid mildly to moderately distending the stomach,
otherwise negative visible bowel gas pattern.

## 2016-09-14 IMAGING — US US ABDOMEN COMPLETE
1 series · 13 of 25 positions shown · non-contrast
Comparison: CT Abdomen and Pelvis 03/10/2012

CLINICAL DATA: 49-year-old male with abnormal LFTs, pain since 0054
hrs. Initial encounter.

EXAM:
ULTRASOUND ABDOMEN COMPLETE

[Series 1: us abdomen complete · 0.21mm/px · 13 of 84 slices shown]
[im 1/84]
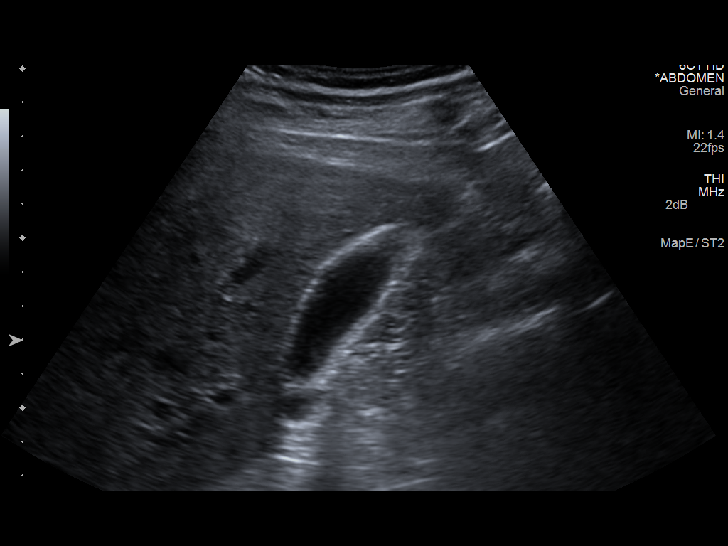
[im 7/84]
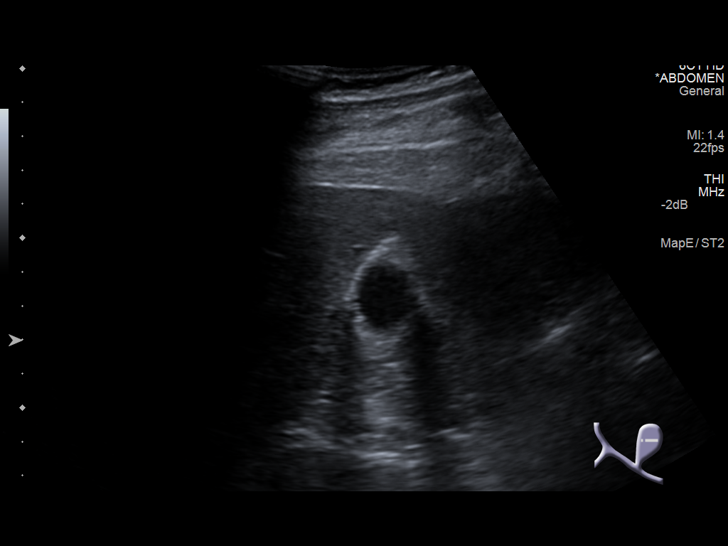
[im 14/84]
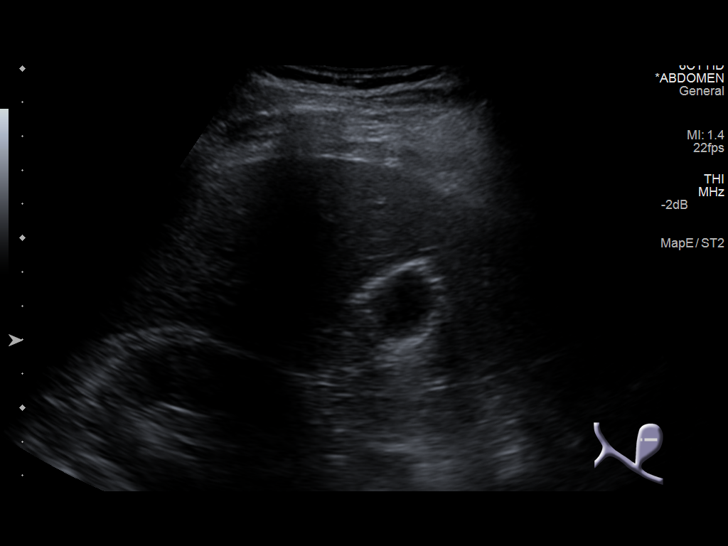
[im 21/84]
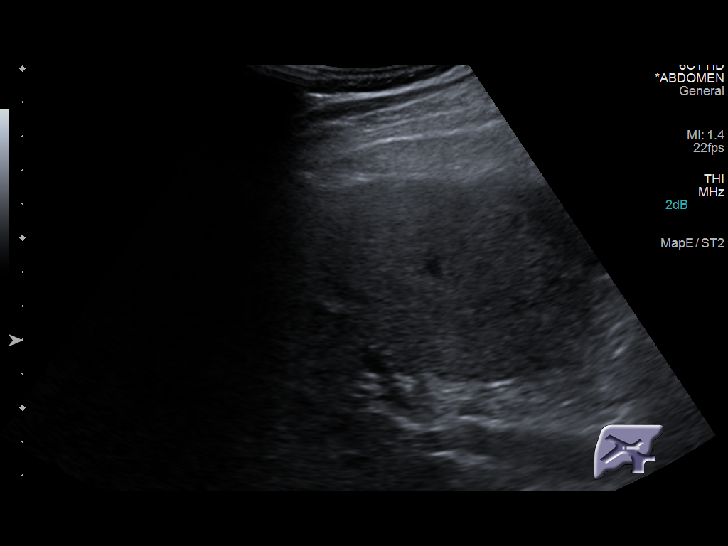
[im 28/84]
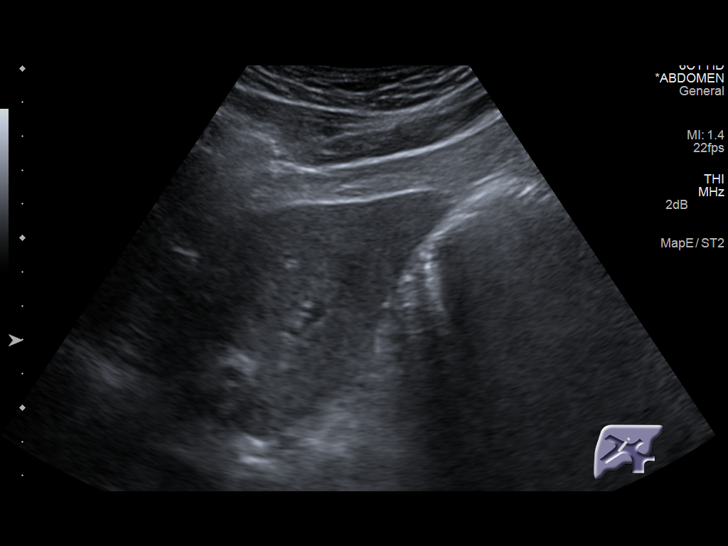
[im 35/84]
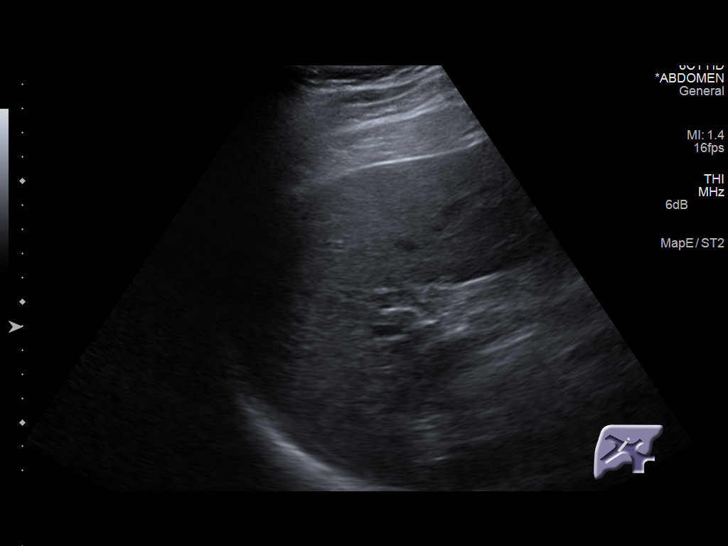
[im 42/84]
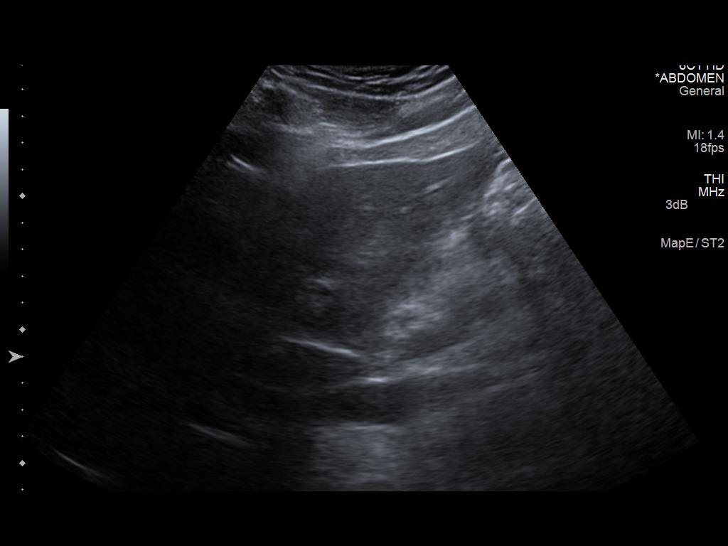
[im 49/84]
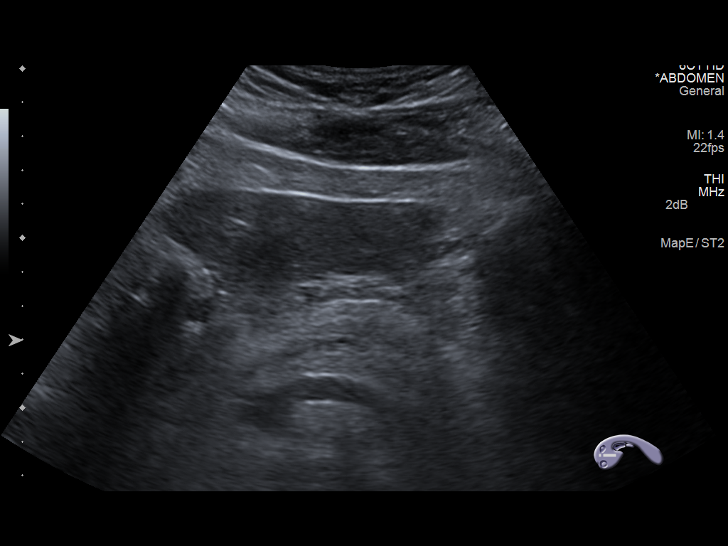
[im 56/84]
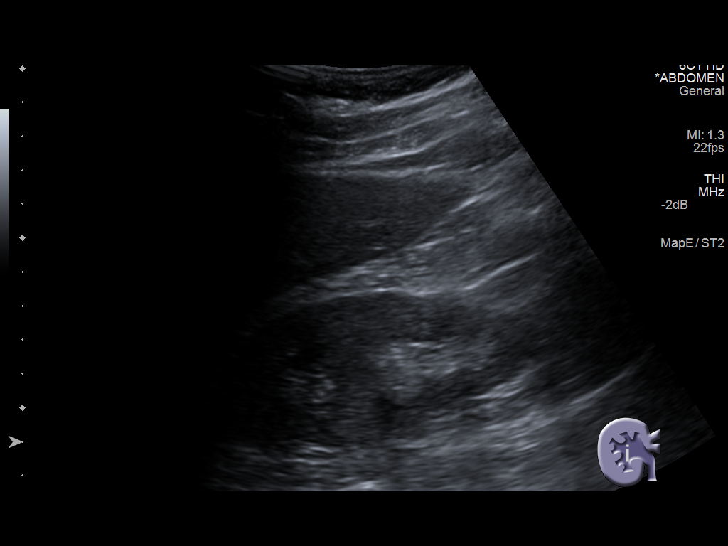
[im 63/84]
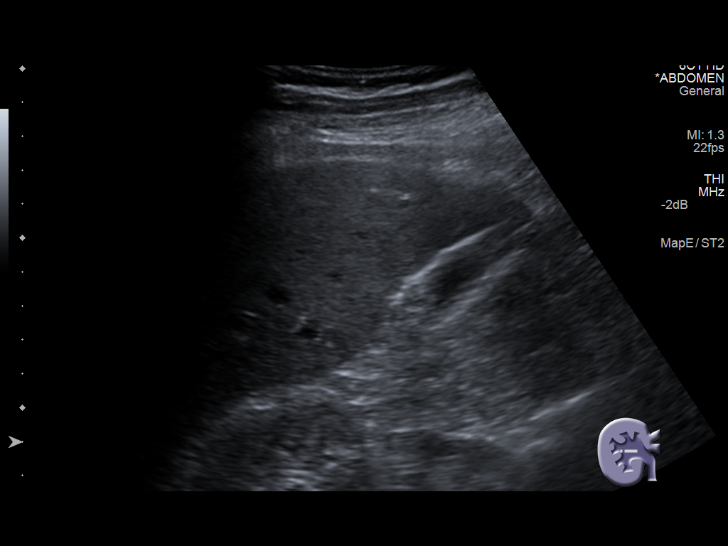
[im 70/84]
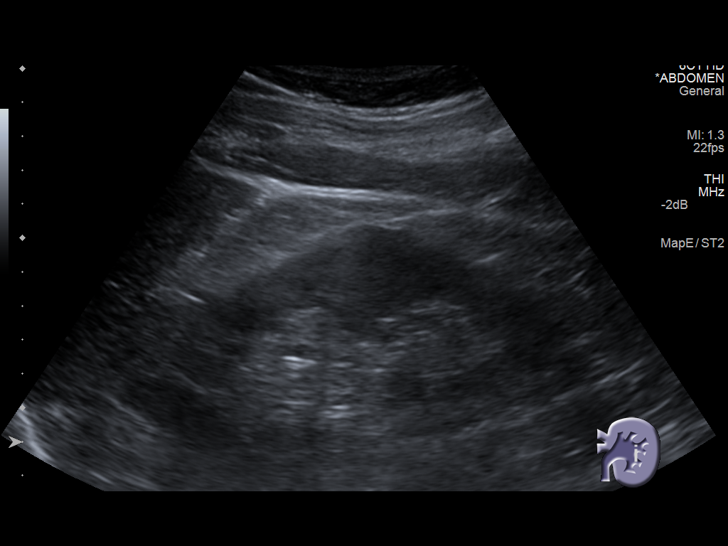
[im 77/84]
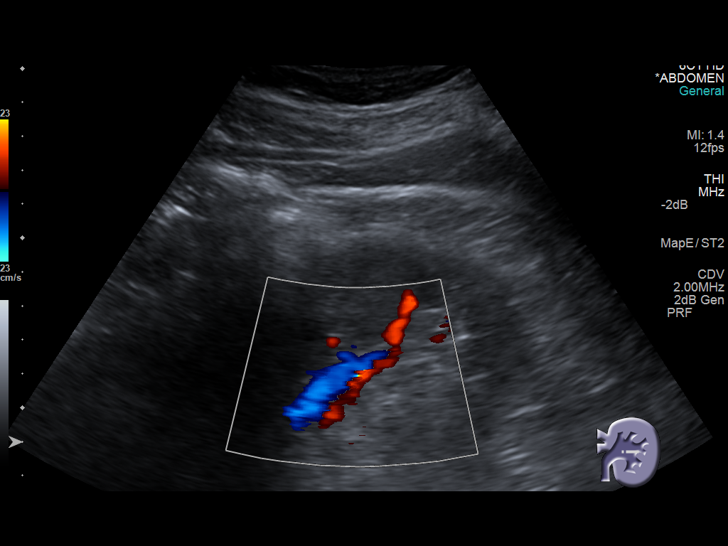
[im 84/84]
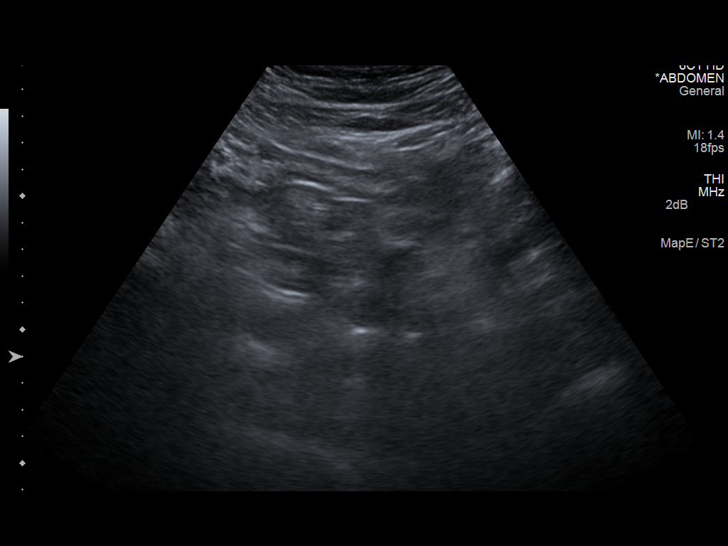

[13 of 25 positions shown; findings below may reference images not displayed]

FINDINGS: Gallbladder: Partially contracted. Gallbladder wall thickening up to
4 mm. However, no gallbladder stones or sludge identified. No
pericholecystic fluid and no sonographic Murphy sign elicited.

Common bile duct: Diameter: 3 mm, normal

Liver: Echogenicity within normal limits. No discrete liver lesion
or intrahepatic biliary ductal dilatation.

IVC: No abnormality visualized.

Pancreas: Incompletely visualized due to overlying bowel gas,
visualized portions within normal limits.

Spleen: Size and appearance within normal limits.

Right Kidney: Length: 11.5 cm. Echogenicity within normal limits. No
mass or hydronephrosis visualized.

Left Kidney: Length: 12.4 cm. Echogenicity within normal limits. No
mass or hydronephrosis visualized.

Abdominal aorta: Incompletely visualized due to overlying bowel gas,
visualized portions within normal limits.

Other findings: None.
IMPRESSION: 1. Gallbladder wall thickening, however favor this is artifactual
due to gallbladder contraction, with no stones identified and no
sonographic Murphy sign elicited.
2. Otherwise negative abdomen ultrasound.
